# Patient Record
Sex: Female | Born: 1970 | Race: White | Hispanic: No | Marital: Married | Smoking: Never smoker
Health system: Southern US, Community
[De-identification: ages and names within clinical notes are randomized; demographics above are authoritative.]

## PROBLEM LIST (undated history)

## (undated) DIAGNOSIS — R921 Mammographic calcification found on diagnostic imaging of breast: Secondary | ICD-10-CM

## (undated) DIAGNOSIS — R51 Headache: Secondary | ICD-10-CM

## (undated) DIAGNOSIS — Z98811 Dental restoration status: Secondary | ICD-10-CM

## (undated) HISTORY — PX: WISDOM TOOTH EXTRACTION: SHX21

---

## 1998-01-26 ENCOUNTER — Other Ambulatory Visit: Admission: RE | Admit: 1998-01-26 | Discharge: 1998-01-26 | Payer: Self-pay | Admitting: Obstetrics and Gynecology

## 1998-02-17 ENCOUNTER — Other Ambulatory Visit: Admission: RE | Admit: 1998-02-17 | Discharge: 1998-02-17 | Payer: Self-pay | Admitting: Obstetrics and Gynecology

## 1998-06-18 ENCOUNTER — Other Ambulatory Visit: Admission: RE | Admit: 1998-06-18 | Discharge: 1998-06-18 | Payer: Self-pay | Admitting: Obstetrics and Gynecology

## 1999-05-24 ENCOUNTER — Other Ambulatory Visit: Admission: RE | Admit: 1999-05-24 | Discharge: 1999-05-24 | Payer: Self-pay | Admitting: Obstetrics and Gynecology

## 1999-05-24 ENCOUNTER — Encounter (INDEPENDENT_AMBULATORY_CARE_PROVIDER_SITE_OTHER): Payer: Self-pay | Admitting: Specialist

## 2000-01-11 ENCOUNTER — Other Ambulatory Visit: Admission: RE | Admit: 2000-01-11 | Discharge: 2000-01-11 | Payer: Self-pay | Admitting: Obstetrics and Gynecology

## 2000-06-01 ENCOUNTER — Other Ambulatory Visit: Admission: RE | Admit: 2000-06-01 | Discharge: 2000-06-01 | Payer: Self-pay | Admitting: Obstetrics and Gynecology

## 2000-11-16 ENCOUNTER — Other Ambulatory Visit: Admission: RE | Admit: 2000-11-16 | Discharge: 2000-11-16 | Payer: Self-pay | Admitting: Obstetrics and Gynecology

## 2001-07-04 ENCOUNTER — Other Ambulatory Visit: Admission: RE | Admit: 2001-07-04 | Discharge: 2001-07-04 | Payer: Self-pay | Admitting: Obstetrics and Gynecology

## 2002-01-22 ENCOUNTER — Other Ambulatory Visit: Admission: RE | Admit: 2002-01-22 | Discharge: 2002-01-22 | Payer: Self-pay | Admitting: Obstetrics and Gynecology

## 2002-07-02 ENCOUNTER — Other Ambulatory Visit: Admission: RE | Admit: 2002-07-02 | Discharge: 2002-07-02 | Payer: Self-pay | Admitting: Obstetrics and Gynecology

## 2003-07-07 ENCOUNTER — Other Ambulatory Visit: Admission: RE | Admit: 2003-07-07 | Discharge: 2003-07-07 | Payer: Self-pay | Admitting: Obstetrics and Gynecology

## 2004-09-07 ENCOUNTER — Other Ambulatory Visit: Admission: RE | Admit: 2004-09-07 | Discharge: 2004-09-07 | Payer: Self-pay | Admitting: Obstetrics and Gynecology

## 2005-11-03 ENCOUNTER — Other Ambulatory Visit: Admission: RE | Admit: 2005-11-03 | Discharge: 2005-11-03 | Payer: Self-pay | Admitting: Obstetrics & Gynecology

## 2005-11-15 ENCOUNTER — Ambulatory Visit (HOSPITAL_COMMUNITY): Admission: RE | Admit: 2005-11-15 | Discharge: 2005-11-15 | Payer: Self-pay | Admitting: Obstetrics & Gynecology

## 2005-11-17 ENCOUNTER — Encounter: Admission: RE | Admit: 2005-11-17 | Discharge: 2005-11-17 | Payer: Self-pay | Admitting: Obstetrics & Gynecology

## 2006-10-04 ENCOUNTER — Inpatient Hospital Stay (HOSPITAL_COMMUNITY): Admission: AD | Admit: 2006-10-04 | Discharge: 2006-10-06 | Payer: Self-pay | Admitting: Obstetrics and Gynecology

## 2006-10-04 ENCOUNTER — Inpatient Hospital Stay (HOSPITAL_COMMUNITY): Admission: AD | Admit: 2006-10-04 | Discharge: 2006-10-04 | Payer: Self-pay | Admitting: *Deleted

## 2008-09-13 ENCOUNTER — Inpatient Hospital Stay (HOSPITAL_COMMUNITY): Admission: AD | Admit: 2008-09-13 | Discharge: 2008-09-16 | Payer: Self-pay | Admitting: Obstetrics and Gynecology

## 2010-03-07 ENCOUNTER — Encounter: Payer: Self-pay | Admitting: Obstetrics and Gynecology

## 2010-05-22 LAB — CBC
HCT: 32.4 % — ABNORMAL LOW (ref 36.0–46.0)
Hemoglobin: 11.3 g/dL — ABNORMAL LOW (ref 12.0–15.0)
MCHC: 34.8 g/dL (ref 30.0–36.0)
MCV: 97.9 fL (ref 78.0–100.0)
RBC: 3.31 MIL/uL — ABNORMAL LOW (ref 3.87–5.11)
WBC: 9.7 10*3/uL (ref 4.0–10.5)

## 2010-05-22 LAB — RH IMMUNE GLOB WKUP(>/=20WKS)(NOT WOMEN'S HOSP)

## 2010-05-22 LAB — CCBB MATERNAL DONOR DRAW

## 2010-05-23 LAB — RPR: RPR Ser Ql: NONREACTIVE

## 2010-05-23 LAB — CBC
MCV: 97.2 fL (ref 78.0–100.0)
Platelets: 193 10*3/uL (ref 150–400)
WBC: 9.8 10*3/uL (ref 4.0–10.5)

## 2010-06-29 NOTE — Consult Note (Signed)
Alexis Bell, Alexis Bell             ACCOUNT NO.:  [id number]   MEDICAL RECORD NO.:  [id number]          PATIENT TYPE:  MAT   LOCATION:  [facility]                          FACILITY:  [facility]   PHYSICIAN:  Lenoard Aden, M.D.DATE OF BIRTH:  1971/02/04   DATE OF CONSULTATION:  DATE OF DISCHARGE:  10/04/2006                                 CONSULTATION   CHIEF COMPLAINT:  Contractions.   She is a 41 year old white female GI, P0 at 38-1/2 weeks of gestation  who presents with increased frequency of contractions this evening.  She  denies leakage of fluid.  She reports good fetal movement.  She denies  bleeding.   ALLERGIES:  She has no known drug allergies.   MEDICATIONS:  Prenatal vitamins.   FAMILY HISTORY:  Diabetes, hypothyroidism, breast cancer, schizophrenia.   SOCIAL HISTORY:  She is a nonsmoker, nondrinker.  She denies domestic or  physical violence.   PAST SURGICAL HISTORY:  She has a surgical history remarkable for a LEEP  in 2000 and also has a personal history of condyloma.   PREGNANCY HISTORY:  Noncontributory.   OBSTETRIC CARE:  To date has been uncomplicated.  She has uncomplicated  __________with a normal 20-week ultrasound and normal ultrasound  revealing an estimated fetal weight in the 27th percentile with slightly  increased amniotic fluid on September 21, 2006.  GBS is negative.   PHYSICAL EXAMINATION:  GENERAL:  She is a well-developed, well-nourished  slightly uncomfortable female in no acute distress.  HEENT:  Normal.  LUNGS:  Clear.  HEART:  Regular rhythm.  ABDOMEN:  Soft, gravid, nontender.  Estimated fetal weight 67 pounds.  Cervix is 1 cm, 90% effaced, vertex in 0 to +1 station.  EXTREMITIES:  There are no cords.  NEUROLOGIC:  Nonfocal.  SKIN:  Intact.   NST is reactive.  Contractions are regular.  The patient is ambulated  for three hours and rechecked with no cervical change noted.   IMPRESSION:  A 38-week __________ prodromal labor pattern with  reassuring fetal heart rates.   PLAN:  Discharge home.  Ambien given.  Follow up in the office as  scheduled.  Strict labor warnings given.      Lenoard Aden, M.D.  Electronically Signed     RJT/MEDQ  D:  10/04/2006  T:  10/04/2006  Job:  [id number]

## 2010-11-26 LAB — CBC
HCT: 33.2 — ABNORMAL LOW
Hemoglobin: 11.6 — ABNORMAL LOW
Hemoglobin: 13.3
MCHC: 34.8
Platelets: 181
RDW: 12.4
RDW: 12.5
WBC: 19 — ABNORMAL HIGH

## 2010-11-26 LAB — RH IMMUNE GLOB WKUP(>/=20WKS)(NOT WOMEN'S HOSP): Fetal Screen: NEGATIVE

## 2011-09-16 ENCOUNTER — Other Ambulatory Visit: Payer: Self-pay | Admitting: Obstetrics and Gynecology

## 2011-09-16 DIAGNOSIS — Z1231 Encounter for screening mammogram for malignant neoplasm of breast: Secondary | ICD-10-CM

## 2011-09-30 ENCOUNTER — Ambulatory Visit
Admission: RE | Admit: 2011-09-30 | Discharge: 2011-09-30 | Disposition: A | Discharge status: Home / Self Care | Payer: 59 | Source: Ambulatory Visit | Attending: Obstetrics and Gynecology | Admitting: Obstetrics and Gynecology

## 2011-09-30 DIAGNOSIS — Z1231 Encounter for screening mammogram for malignant neoplasm of breast: Secondary | ICD-10-CM

## 2011-10-05 ENCOUNTER — Other Ambulatory Visit: Payer: Self-pay | Admitting: Obstetrics and Gynecology

## 2011-10-05 DIAGNOSIS — R928 Other abnormal and inconclusive findings on diagnostic imaging of breast: Secondary | ICD-10-CM

## 2011-10-12 ENCOUNTER — Other Ambulatory Visit: Payer: Self-pay | Admitting: Obstetrics and Gynecology

## 2011-10-12 ENCOUNTER — Ambulatory Visit
Admission: RE | Admit: 2011-10-12 | Discharge: 2011-10-12 | Disposition: A | Discharge status: Home / Self Care | Payer: 59 | Source: Ambulatory Visit | Attending: Obstetrics and Gynecology | Admitting: Obstetrics and Gynecology

## 2011-10-12 DIAGNOSIS — R928 Other abnormal and inconclusive findings on diagnostic imaging of breast: Secondary | ICD-10-CM

## 2011-10-12 DIAGNOSIS — R921 Mammographic calcification found on diagnostic imaging of breast: Secondary | ICD-10-CM

## 2011-10-20 ENCOUNTER — Ambulatory Visit
Admission: RE | Admit: 2011-10-20 | Discharge: 2011-10-20 | Disposition: A | Discharge status: Home / Self Care | Payer: 59 | Source: Ambulatory Visit | Attending: Obstetrics and Gynecology | Admitting: Obstetrics and Gynecology

## 2011-10-20 ENCOUNTER — Other Ambulatory Visit: Payer: Self-pay | Admitting: Obstetrics and Gynecology

## 2011-10-20 DIAGNOSIS — R921 Mammographic calcification found on diagnostic imaging of breast: Secondary | ICD-10-CM

## 2011-11-07 ENCOUNTER — Ambulatory Visit (INDEPENDENT_AMBULATORY_CARE_PROVIDER_SITE_OTHER): Payer: 59 | Admitting: Surgery

## 2011-11-15 DIAGNOSIS — R921 Mammographic calcification found on diagnostic imaging of breast: Secondary | ICD-10-CM

## 2011-11-15 HISTORY — DX: Mammographic calcification found on diagnostic imaging of breast: R92.1

## 2011-11-17 ENCOUNTER — Ambulatory Visit (INDEPENDENT_AMBULATORY_CARE_PROVIDER_SITE_OTHER): Payer: 59 | Admitting: Surgery

## 2011-11-17 ENCOUNTER — Encounter (INDEPENDENT_AMBULATORY_CARE_PROVIDER_SITE_OTHER): Payer: Self-pay | Admitting: Surgery

## 2011-11-17 VITALS — BP 94/58 | HR 68 | Temp 99.2°F | Resp 16 | Ht 68.0 in | Wt 143.6 lb

## 2011-11-17 DIAGNOSIS — R921 Mammographic calcification found on diagnostic imaging of breast: Secondary | ICD-10-CM

## 2011-11-17 DIAGNOSIS — R928 Other abnormal and inconclusive findings on diagnostic imaging of breast: Secondary | ICD-10-CM

## 2011-11-17 NOTE — Patient Instructions (Signed)
We will schedule outpatient surgery to remove the small calcifications in your left breast

## 2011-11-17 NOTE — Progress Notes (Signed)
Patient ID: Alexis Bell, female   DOB: [birth year], 41 y.o.   MRN: [id number]  Chief Complaint  Patient presents with  . Breast Problem    eval Lt br calcifications    HPI Alexis Bell is a 41 y.o. female.  She was recently found to have a small focus of linear calcifications in the upper left breast. She is not a candidate for a needle core biopsies of surgical excision was recommended. She had her original films done at the breast center as a second opinion from the other radiologists in town. She is no other family history of significance and no symptoms in her breast. HPI  History reviewed. No pertinent past medical history.  History reviewed. No pertinent past surgical history.  Family History  Problem Relation Age of Onset  . Cancer Maternal Grandmother     breast    Social History History  Substance Use Topics  . Smoking status: Never Smoker   . Smokeless tobacco: Never Used  . Alcohol Use: No    No Known Allergies  No current outpatient prescriptions on file.    Review of Systems Review of Systems  Constitutional: Negative for fever, chills and unexpected weight change.  HENT: Negative for hearing loss, congestion, sore throat, trouble swallowing and voice change.   Eyes: Negative for visual disturbance.  Respiratory: Negative for cough and wheezing.   Cardiovascular: Negative for chest pain, palpitations and leg swelling.  Gastrointestinal: Negative for nausea, vomiting, abdominal pain, diarrhea, constipation, blood in stool, abdominal distention and anal bleeding.  Genitourinary: Negative for hematuria, vaginal bleeding and difficulty urinating.  Musculoskeletal: Negative for arthralgias.  Skin: Negative for rash and wound.  Neurological: Negative for seizures, syncope and headaches.  Hematological: Negative for adenopathy. Does not bruise/bleed easily.  Psychiatric/Behavioral: Negative for confusion.    Blood pressure 94/58, pulse 68, temperature  99.2 F (37.3 C), temperature source Temporal, resp. rate 16, height 5\' 8"  (1.727 m), weight 143 lb 9.6 oz (65.137 kg), last menstrual period 11/03/2011.  Physical Exam Physical Exam  Vitals reviewed. Constitutional: She is oriented to person, place, and time. She appears well-developed and well-nourished. No distress.  HENT:  Head: Normocephalic and atraumatic.  Mouth/Throat: Oropharynx is clear and moist.  Eyes: Conjunctivae normal and EOM are normal. Pupils are equal, round, and reactive to light. No scleral icterus.  Neck: Normal range of motion. Neck supple. No tracheal deviation present. No thyromegaly present.  Cardiovascular: Normal rate, regular rhythm, normal heart sounds and intact distal pulses.  Exam reveals no gallop and no friction rub.   No murmur heard. Pulmonary/Chest: Effort normal and breath sounds normal. No respiratory distress. She has no wheezes. She has no rales.       Breasts are bilaterally symmetric without mass. They are not tender.  Abdominal: Soft. Bowel sounds are normal. She exhibits no distension and no mass. There is no tenderness. There is no rebound and no guarding.  Musculoskeletal: Normal range of motion. She exhibits no edema and no tenderness.  Lymphadenopathy:    She has no cervical adenopathy.    She has axillary adenopathy.       Right: No supraclavicular adenopathy present.  Neurological: She is alert and oriented to person, place, and time.  Skin: Skin is warm and dry. No rash noted. She is not diaphoretic. No erythema.  Psychiatric: She has a normal mood and affect. Her behavior is normal. Judgment and thought content normal.    Data Reviewed I reviewed the mammogram  reports.  Assessment    Somewhat suspicious calcifications left breast    Plan    I think she needs to have a wire localized excision. She would like to schedule this. I have discussed the indications for the lumpectomy and described the procedure. She understand that  the chance of removal of the abnormal area is very good, but that occasionally we are unable to locate it and may have to do a second procedure. We also discussed the possibility of a second procedure to get additional tissue. Risks of surgery such as bleeding and infection have also been explained, as well as the implications of not doing the surgery. She understands and wishes to proceed.        Aysen Shieh J 11/17/2011, 4:45 PM

## 2011-12-06 ENCOUNTER — Encounter (HOSPITAL_BASED_OUTPATIENT_CLINIC_OR_DEPARTMENT_OTHER): Payer: Self-pay | Admitting: *Deleted

## 2011-12-12 ENCOUNTER — Ambulatory Visit (HOSPITAL_BASED_OUTPATIENT_CLINIC_OR_DEPARTMENT_OTHER): Payer: 59 | Admitting: Certified Registered Nurse Anesthetist

## 2011-12-12 ENCOUNTER — Encounter (HOSPITAL_BASED_OUTPATIENT_CLINIC_OR_DEPARTMENT_OTHER): Payer: Self-pay | Admitting: Certified Registered Nurse Anesthetist

## 2011-12-12 ENCOUNTER — Ambulatory Visit (HOSPITAL_BASED_OUTPATIENT_CLINIC_OR_DEPARTMENT_OTHER)
Admission: RE | Admit: 2011-12-12 | Discharge: 2011-12-12 | Disposition: A | Discharge status: Home / Self Care | Payer: 59 | Source: Ambulatory Visit | Attending: Surgery | Admitting: Surgery

## 2011-12-12 ENCOUNTER — Encounter (HOSPITAL_BASED_OUTPATIENT_CLINIC_OR_DEPARTMENT_OTHER): Payer: Self-pay | Admitting: *Deleted

## 2011-12-12 ENCOUNTER — Encounter (HOSPITAL_BASED_OUTPATIENT_CLINIC_OR_DEPARTMENT_OTHER)
Admission: RE | Disposition: A | Discharge status: Home / Self Care | Payer: Self-pay | Source: Ambulatory Visit | Attending: Surgery

## 2011-12-12 DIAGNOSIS — Z803 Family history of malignant neoplasm of breast: Secondary | ICD-10-CM | POA: Insufficient documentation

## 2011-12-12 DIAGNOSIS — R921 Mammographic calcification found on diagnostic imaging of breast: Secondary | ICD-10-CM

## 2011-12-12 DIAGNOSIS — D249 Benign neoplasm of unspecified breast: Secondary | ICD-10-CM | POA: Insufficient documentation

## 2011-12-12 DIAGNOSIS — N6019 Diffuse cystic mastopathy of unspecified breast: Secondary | ICD-10-CM

## 2011-12-12 DIAGNOSIS — R920 Mammographic microcalcification found on diagnostic imaging of breast: Secondary | ICD-10-CM | POA: Insufficient documentation

## 2011-12-12 HISTORY — DX: Headache: R51

## 2011-12-12 HISTORY — PX: BREAST BIOPSY: SHX20

## 2011-12-12 HISTORY — DX: Mammographic calcification found on diagnostic imaging of breast: R92.1

## 2011-12-12 HISTORY — DX: Dental restoration status: Z98.811

## 2011-12-12 SURGERY — BREAST BIOPSY WITH NEEDLE LOCALIZATION
Anesthesia: General | Site: Breast | Laterality: Left | Wound class: Clean

## 2011-12-12 MED ORDER — ACETAMINOPHEN 10 MG/ML IV SOLN
INTRAVENOUS | Status: DC | PRN
  Administered 2011-12-12: 1000 mg via INTRAVENOUS

## 2011-12-12 MED ORDER — DEXAMETHASONE SODIUM PHOSPHATE 4 MG/ML IJ SOLN
INTRAMUSCULAR | Status: DC | PRN
  Administered 2011-12-12: 10 mg via INTRAVENOUS

## 2011-12-12 MED ORDER — BUPIVACAINE HCL (PF) 0.25 % IJ SOLN
INTRAMUSCULAR | Status: DC | PRN
  Administered 2011-12-12: 20 mL

## 2011-12-12 MED ORDER — PROPOFOL 10 MG/ML IV BOLUS
INTRAVENOUS | Status: DC | PRN
  Administered 2011-12-12: 200 mg via INTRAVENOUS

## 2011-12-12 MED ORDER — CHLORHEXIDINE GLUCONATE 4 % EX LIQD
1.0000 | Freq: Once | CUTANEOUS | Status: DC
Start: 2011-12-12 — End: 2011-12-12

## 2011-12-12 MED ORDER — FENTANYL CITRATE 0.05 MG/ML IJ SOLN
INTRAMUSCULAR | Status: DC | PRN
  Administered 2011-12-12: 25 ug via INTRAVENOUS
  Administered 2011-12-12: 50 ug via INTRAVENOUS
  Administered 2011-12-12: 25 ug via INTRAVENOUS

## 2011-12-12 MED ORDER — LIDOCAINE HCL (CARDIAC) 20 MG/ML IV SOLN
INTRAVENOUS | Status: DC | PRN
  Administered 2011-12-12: 60 mg via INTRAVENOUS

## 2011-12-12 MED ORDER — LACTATED RINGERS IV SOLN
INTRAVENOUS | Status: DC
  Administered 2011-12-12 (×2): via INTRAVENOUS

## 2011-12-12 MED ORDER — OXYCODONE HCL 5 MG PO TABS: 5.0000 mg | ORAL_TABLET | ORAL | Status: DC | PRN

## 2011-12-12 MED ORDER — ONDANSETRON HCL 4 MG/2ML IJ SOLN
INTRAMUSCULAR | Status: DC | PRN
  Administered 2011-12-12: 4 mg via INTRAVENOUS

## 2011-12-12 MED ORDER — MIDAZOLAM HCL 5 MG/5ML IJ SOLN
INTRAMUSCULAR | Status: DC | PRN
  Administered 2011-12-12: 2 mg via INTRAVENOUS

## 2011-12-12 MED ORDER — CEFAZOLIN SODIUM-DEXTROSE 2-3 GM-% IV SOLR
2.0000 g | INTRAVENOUS | Status: AC
  Administered 2011-12-12: 2 g via INTRAVENOUS

## 2011-12-12 MED ORDER — CHLORHEXIDINE GLUCONATE 4 % EX LIQD: 1.0000 "application " | Freq: Once | CUTANEOUS | Status: DC

## 2011-12-12 SURGICAL SUPPLY — 50 items
ADH SKN CLS APL DERMABOND .7 (GAUZE/BANDAGES/DRESSINGS) ×1
APPLICATOR COTTON TIP 6IN STRL (MISCELLANEOUS) IMPLANT
BINDER BREAST LRG (GAUZE/BANDAGES/DRESSINGS) IMPLANT
BINDER BREAST MEDIUM (GAUZE/BANDAGES/DRESSINGS) IMPLANT
BINDER BREAST XLRG (GAUZE/BANDAGES/DRESSINGS) IMPLANT
BINDER BREAST XXLRG (GAUZE/BANDAGES/DRESSINGS) IMPLANT
BLADE HEX COATED 2.75 (ELECTRODE) ×2 IMPLANT
BLADE SURG 15 STRL LF DISP TIS (BLADE) ×1 IMPLANT
BLADE SURG 15 STRL SS (BLADE) ×2
CANISTER SUCTION 1200CC (MISCELLANEOUS) ×2 IMPLANT
CHLORAPREP W/TINT 26ML (MISCELLANEOUS) ×2 IMPLANT
CLIP TI MEDIUM 6 (CLIP) IMPLANT
CLIP TI WIDE RED SMALL 6 (CLIP) IMPLANT
CLOTH BEACON ORANGE TIMEOUT ST (SAFETY) ×2 IMPLANT
COVER MAYO STAND STRL (DRAPES) ×2 IMPLANT
COVER TABLE BACK 60X90 (DRAPES) ×2 IMPLANT
DECANTER SPIKE VIAL GLASS SM (MISCELLANEOUS) IMPLANT
DERMABOND ADVANCED (GAUZE/BANDAGES/DRESSINGS) ×1
DERMABOND ADVANCED .7 DNX12 (GAUZE/BANDAGES/DRESSINGS) ×1 IMPLANT
DEVICE DUBIN W/COMP PLATE 8390 (MISCELLANEOUS) IMPLANT
DRAPE LAPAROTOMY TRNSV 102X78 (DRAPE) ×2 IMPLANT
DRAPE SURG 17X23 STRL (DRAPES) ×1 IMPLANT
DRAPE UTILITY XL STRL (DRAPES) ×2 IMPLANT
ELECT REM PT RETURN 9FT ADLT (ELECTROSURGICAL) ×2
ELECTRODE REM PT RTRN 9FT ADLT (ELECTROSURGICAL) ×1 IMPLANT
GLOVE BIO SURGEON STRL SZ 6.5 (GLOVE) ×1 IMPLANT
GLOVE BIOGEL PI IND STRL 7.0 (GLOVE) IMPLANT
GLOVE BIOGEL PI INDICATOR 7.0 (GLOVE) ×1
GLOVE EUDERMIC 7 POWDERFREE (GLOVE) ×2 IMPLANT
GOWN PREVENTION PLUS XLARGE (GOWN DISPOSABLE) ×4 IMPLANT
KIT MARKER MARGIN INK (KITS) IMPLANT
NDL HYPO 25X1 1.5 SAFETY (NEEDLE) ×1 IMPLANT
NEEDLE HYPO 25X1 1.5 SAFETY (NEEDLE) ×2 IMPLANT
NS IRRIG 1000ML POUR BTL (IV SOLUTION) IMPLANT
PACK BASIN DAY SURGERY FS (CUSTOM PROCEDURE TRAY) ×2 IMPLANT
PENCIL BUTTON HOLSTER BLD 10FT (ELECTRODE) ×2 IMPLANT
SHEET MEDIUM DRAPE 40X70 STRL (DRAPES) ×1 IMPLANT
SLEEVE SCD COMPRESS KNEE MED (MISCELLANEOUS) ×2 IMPLANT
SPONGE GAUZE 4X4 12PLY (GAUZE/BANDAGES/DRESSINGS) IMPLANT
SPONGE INTESTINAL PEANUT (DISPOSABLE) IMPLANT
SPONGE LAP 4X18 X RAY DECT (DISPOSABLE) ×2 IMPLANT
STAPLER VISISTAT 35W (STAPLE) IMPLANT
SUT MNCRL AB 4-0 PS2 18 (SUTURE) ×2 IMPLANT
SUT SILK 0 TIES 10X30 (SUTURE) IMPLANT
SUT VICRYL 3-0 CR8 SH (SUTURE) ×2 IMPLANT
SYR CONTROL 10ML LL (SYRINGE) ×2 IMPLANT
TOWEL OR NON WOVEN STRL DISP B (DISPOSABLE) ×2 IMPLANT
TUBE CONNECTING 20X1/4 (TUBING) ×2 IMPLANT
WATER STERILE IRR 1000ML POUR (IV SOLUTION) ×1 IMPLANT
YANKAUER SUCT BULB TIP NO VENT (SUCTIONS) ×2 IMPLANT

## 2011-12-12 NOTE — Anesthesia Postprocedure Evaluation (Signed)
  Anesthesia Post-op Note  Patient: Alexis Bell  Procedure(s) Performed: Procedure(s) (LRB) with comments: BREAST BIOPSY WITH NEEDLE LOCALIZATION (Left) - Wire localization excision Left breast calcifications  Patient Location: PACU  Anesthesia Type: @ANTYPEFINAL @   Level of Consciousness: awake, alert  and oriented  Airway and Oxygen Therapy: Patient Spontanous Breathing  Post-op Pain: none  Post-op Assessment: Post-op Vital signs reviewed  Post-op Vital Signs: Reviewed  Complications: No apparent anesthesia complications

## 2011-12-12 NOTE — Anesthesia Procedure Notes (Signed)
Procedure Name: LMA Insertion Date/Time: 12/12/2011 1:11 PM Performed by: Dimitrious Micciche D Pre-anesthesia Checklist: Patient identified, Emergency Drugs available, Suction available and Patient being monitored Patient Re-evaluated:Patient Re-evaluated prior to inductionOxygen Delivery Method: Circle System Utilized Preoxygenation: Pre-oxygenation with 100% oxygen Intubation Type: IV induction Ventilation: Mask ventilation without difficulty LMA: LMA inserted LMA Size: 4.0 Number of attempts: 1 Airway Equipment and Method: bite block Placement Confirmation: positive ETCO2 Tube secured with: Tape Dental Injury: Teeth and Oropharynx as per pre-operative assessment

## 2011-12-12 NOTE — Anesthesia Preprocedure Evaluation (Signed)

## 2011-12-12 NOTE — Anesthesia Postprocedure Evaluation (Signed)
  Anesthesia Post-op Note  Patient: Alexis Bell  Procedure(s) Performed: Procedure(s) (LRB) with comments: BREAST BIOPSY WITH NEEDLE LOCALIZATION (Left) - Wire localization excision Left breast calcifications  Patient Location: PACU  Anesthesia Type: General  Level of Consciousness: awake, alert  and oriented  Airway and Oxygen Therapy: Patient Spontanous Breathing  Post-op Pain: none  Post-op Assessment: Post-op Vital signs reviewed  Post-op Vital Signs: Reviewed  Complications: No apparent anesthesia complications

## 2011-12-12 NOTE — H&P (View-Only) (Signed)
Patient ID: Alexis Bell, female   DOB: 01/11/1971, 41 y.o.   MRN: [id number]  Chief Complaint  Patient presents with  . Breast Problem    eval Lt br calcifications    HPI Alexis Bell is a 41 y.o. female.  She was recently found to have a small focus of linear calcifications in the upper left breast. She is not a candidate for a needle core biopsies of surgical excision was recommended. She had her original films done at the breast center as a second opinion from the other radiologists in town. She is no other family history of significance and no symptoms in her breast. HPI  History reviewed. No pertinent past medical history.  History reviewed. No pertinent past surgical history.  Family History  Problem Relation Age of Onset  . Cancer Maternal Grandmother     breast    Social History History  Substance Use Topics  . Smoking status: Never Smoker   . Smokeless tobacco: Never Used  . Alcohol Use: No    No Known Allergies  No current outpatient prescriptions on file.    Review of Systems Review of Systems  Constitutional: Negative for fever, chills and unexpected weight change.  HENT: Negative for hearing loss, congestion, sore throat, trouble swallowing and voice change.   Eyes: Negative for visual disturbance.  Respiratory: Negative for cough and wheezing.   Cardiovascular: Negative for chest pain, palpitations and leg swelling.  Gastrointestinal: Negative for nausea, vomiting, abdominal pain, diarrhea, constipation, blood in stool, abdominal distention and anal bleeding.  Genitourinary: Negative for hematuria, vaginal bleeding and difficulty urinating.  Musculoskeletal: Negative for arthralgias.  Skin: Negative for rash and wound.  Neurological: Negative for seizures, syncope and headaches.  Hematological: Negative for adenopathy. Does not bruise/bleed easily.  Psychiatric/Behavioral: Negative for confusion.    Blood pressure 94/58, pulse 68, temperature  99.2 F (37.3 C), temperature source Temporal, resp. rate 16, height 5' 8" (1.727 m), weight 143 lb 9.6 oz (65.137 kg), last menstrual period 11/03/2011.  Physical Exam Physical Exam  Vitals reviewed. Constitutional: She is oriented to person, place, and time. She appears well-developed and well-nourished. No distress.  HENT:  Head: Normocephalic and atraumatic.  Mouth/Throat: Oropharynx is clear and moist.  Eyes: Conjunctivae normal and EOM are normal. Pupils are equal, round, and reactive to light. No scleral icterus.  Neck: Normal range of motion. Neck supple. No tracheal deviation present. No thyromegaly present.  Cardiovascular: Normal rate, regular rhythm, normal heart sounds and intact distal pulses.  Exam reveals no gallop and no friction rub.   No murmur heard. Pulmonary/Chest: Effort normal and breath sounds normal. No respiratory distress. She has no wheezes. She has no rales.       Breasts are bilaterally symmetric without mass. They are not tender.  Abdominal: Soft. Bowel sounds are normal. She exhibits no distension and no mass. There is no tenderness. There is no rebound and no guarding.  Musculoskeletal: Normal range of motion. She exhibits no edema and no tenderness.  Lymphadenopathy:    She has no cervical adenopathy.    She has axillary adenopathy.       Right: No supraclavicular adenopathy present.  Neurological: She is alert and oriented to person, place, and time.  Skin: Skin is warm and dry. No rash noted. She is not diaphoretic. No erythema.  Psychiatric: She has a normal mood and affect. Her behavior is normal. Judgment and thought content normal.    Data Reviewed I reviewed the mammogram   reports.  Assessment    Somewhat suspicious calcifications left breast    Plan    I think she needs to have a wire localized excision. She would like to schedule this. I have discussed the indications for the lumpectomy and described the procedure. She understand that  the chance of removal of the abnormal area is very good, but that occasionally we are unable to locate it and may have to do a second procedure. We also discussed the possibility of a second procedure to get additional tissue. Risks of surgery such as bleeding and infection have also been explained, as well as the implications of not doing the surgery. She understands and wishes to proceed.        Song Garris J 11/17/2011, 4:45 PM    

## 2011-12-12 NOTE — Interval H&P Note (Signed)
History and Physical Interval Note:  12/12/2011 12:49 PM  Alexis Bell  has presented today for surgery, with the diagnosis of Left breast calcifications  The various methods of treatment have been discussed with the patient and family. After consideration of risks, benefits and other options for treatment, the patient has consented to  Procedure(s) (LRB) with comments: BREAST BIOPSY WITH NEEDLE LOCALIZATION (Left) - Wire localization excision Left breast calcifications as a surgical intervention .  The patient's history has been reviewed, patient examined, no change in status, stable for surgery.  I have reviewed the patient's chart and labs.  Questions were answered to the patient's satisfaction.   The left breast is marked at the operative side and the wire loc films reviewed  [name]

## 2011-12-12 NOTE — Op Note (Signed)
Hermina Baumgardner Mumford  10/02/1970  [id number]  12/12/2011   Preoperative diagnosis: Left breast calcifications  Postoperative diagnosis: sAME  Procedure: Nl Excisin of left berast calcifications  Surgeon: Currie Paris, MD, FACS  Anesthesia: General  Clinical History and Indications: this patient presents for a guidewire localized excision of a left breast area of Ca++.  Description of procedure: The patient was seen in the holding area and the plans for the procedure reviewed. The left breast was marked as the operative side. The wire localizing films were reviewed.  The patient was taken to the operating room and after satisfactory general anesthesia had been obtained the left breast was prepped and draped and the timeout was performed.  The incision was made over the presumed area of the mass. The guidewire entered in the UOQ and tracked inferior and lateral. Skin flaps were raised and using cautery the area was completely excised. Bleeders were controlled with either cautery or sutures as needed.  After achieving hemostasis, the incision was closed with 3-0 Vicryl, 4-0 Monocryl subcuticular, and Dermabond.  The patient tolerated the procedure well. There were no operative complications. All counts were correct.   EBL: Minimal  Currie Paris, MD, FACS 12/12/2011 1:50 PM

## 2011-12-13 ENCOUNTER — Encounter (HOSPITAL_BASED_OUTPATIENT_CLINIC_OR_DEPARTMENT_OTHER): Payer: Self-pay | Admitting: Certified Registered Nurse Anesthetist

## 2011-12-13 NOTE — Transfer of Care (Signed)
Immediate Anesthesia Transfer of Care Note  Patient: Alexis Bell  Procedure(s) Performed: Procedure(s) (LRB) with comments: BREAST BIOPSY WITH NEEDLE LOCALIZATION (Left) - Wire localization excision Left breast calcifications  Patient Location: PACU  Anesthesia Type:General  Level of Consciousness: awake, alert , oriented and patient cooperative  Airway & Oxygen Therapy: Patient Spontanous Breathing and Patient connected to face mask oxygen  Post-op Assessment: Report given to PACU RN and Post -op Vital signs reviewed and stable  Post vital signs: Reviewed and stable  Complications: No apparent anesthesia complications

## 2011-12-15 ENCOUNTER — Telehealth (INDEPENDENT_AMBULATORY_CARE_PROVIDER_SITE_OTHER): Payer: Self-pay | Admitting: General Surgery

## 2011-12-15 NOTE — Telephone Encounter (Signed)
Patient made aware of pathology results. Benign fibroadenoma.

## 2011-12-27 ENCOUNTER — Encounter (INDEPENDENT_AMBULATORY_CARE_PROVIDER_SITE_OTHER): Payer: Self-pay | Admitting: Surgery

## 2011-12-27 ENCOUNTER — Ambulatory Visit (INDEPENDENT_AMBULATORY_CARE_PROVIDER_SITE_OTHER): Payer: 59 | Admitting: Surgery

## 2011-12-27 VITALS — BP 118/82 | HR 68 | Temp 99.2°F | Resp 14 | Ht 68.0 in | Wt 144.0 lb

## 2011-12-27 DIAGNOSIS — R921 Mammographic calcification found on diagnostic imaging of breast: Secondary | ICD-10-CM

## 2011-12-27 DIAGNOSIS — R928 Other abnormal and inconclusive findings on diagnostic imaging of breast: Secondary | ICD-10-CM

## 2011-12-27 NOTE — Progress Notes (Signed)
NAME: Alexis Bell                                            DOB: 04-22-1970 DATE: 12/27/2011                                                  MRN: [id number]  CC: Post op   HPI: This patient comes in for post op follow-up .Sheunderwent wire localized excision of some left breast calcifications on /28/2013. She feels that she is doing well.  PE:  VITAL SIGNS: BP 118/82  Pulse 68  Temp 99.2 F (37.3 C) (Temporal)  Resp 14  Ht 5\' 8"  (1.727 m)  Wt 144 lb (65.318 kg)  BMI 21.90 kg/m2  LMP 12/02/2011  General: The patient appears to be healthy, NAD No particular problems noted. Breast incision is healing nicely. No hematoma.  DATA REVIEWED:  is noted and shows a fibroadenoma with calcifications as well as some fibrocystic changes with calcifications.  IMPRESSION: The patient is doing well S/P wire localized left breast biopsy.    PLAN: Gave her a copy of the pathology report and discuss it with her. I will see her back here when necessary. Encouraged her to continue annual mammograms and routine follow up with her primary physicians.

## 2011-12-27 NOTE — Patient Instructions (Signed)
We will see you again on an as needed basis. Please call the office at [phone] if you have any questions or concerns. Thank you for allowing us to take care of you.  

## 2012-03-05 ENCOUNTER — Encounter (INDEPENDENT_AMBULATORY_CARE_PROVIDER_SITE_OTHER): Payer: Self-pay

## 2012-12-11 ENCOUNTER — Encounter (INDEPENDENT_AMBULATORY_CARE_PROVIDER_SITE_OTHER): Payer: Self-pay

## 2012-12-12 ENCOUNTER — Encounter (INDEPENDENT_AMBULATORY_CARE_PROVIDER_SITE_OTHER): Payer: Self-pay

## 2016-02-01 ENCOUNTER — Other Ambulatory Visit: Payer: Self-pay | Admitting: Internal Medicine

## 2016-02-01 DIAGNOSIS — M545 Low back pain: Secondary | ICD-10-CM

## 2016-02-10 ENCOUNTER — Ambulatory Visit
Admission: RE | Admit: 2016-02-10 | Discharge: 2016-02-10 | Disposition: A | Discharge status: Home / Self Care | Payer: Managed Care, Other (non HMO) | Source: Ambulatory Visit | Attending: Internal Medicine | Admitting: Internal Medicine

## 2016-02-10 DIAGNOSIS — M545 Low back pain: Secondary | ICD-10-CM

## 2016-02-10 MED ORDER — GADOBENATE DIMEGLUMINE 529 MG/ML IV SOLN
13.0000 mL | Freq: Once | INTRAVENOUS | Status: AC | PRN
  Administered 2016-02-10: 13 mL via INTRAVENOUS

## 2018-03-19 ENCOUNTER — Other Ambulatory Visit: Payer: Self-pay | Admitting: Internal Medicine

## 2018-03-19 DIAGNOSIS — M5416 Radiculopathy, lumbar region: Secondary | ICD-10-CM

## 2018-03-24 ENCOUNTER — Ambulatory Visit
Admission: RE | Admit: 2018-03-24 | Discharge: 2018-03-24 | Disposition: A | Discharge status: Home / Self Care | Payer: BC Managed Care – PPO | Source: Ambulatory Visit | Attending: Internal Medicine | Admitting: Internal Medicine

## 2018-03-24 DIAGNOSIS — M5416 Radiculopathy, lumbar region: Secondary | ICD-10-CM

## 2018-04-29 ENCOUNTER — Encounter (HOSPITAL_COMMUNITY): Payer: Self-pay | Admitting: Emergency Medicine

## 2018-04-29 ENCOUNTER — Other Ambulatory Visit: Payer: Self-pay

## 2018-04-29 ENCOUNTER — Emergency Department (HOSPITAL_COMMUNITY)
Admission: EM | Admit: 2018-04-29 | Discharge: 2018-04-29 | Disposition: A | Discharge status: Home / Self Care | Payer: BC Managed Care – PPO | Attending: Emergency Medicine | Admitting: Emergency Medicine

## 2018-04-29 DIAGNOSIS — R531 Weakness: Secondary | ICD-10-CM | POA: Diagnosis not present

## 2018-04-29 LAB — URINALYSIS, ROUTINE W REFLEX MICROSCOPIC
Bilirubin Urine: NEGATIVE
Glucose, UA: NEGATIVE mg/dL
Hgb urine dipstick: NEGATIVE
Ketones, ur: NEGATIVE mg/dL
Leukocytes,Ua: NEGATIVE
Nitrite: NEGATIVE
Protein, ur: NEGATIVE mg/dL
Specific Gravity, Urine: 1.01 (ref 1.005–1.030)
pH: 7 (ref 5.0–8.0)

## 2018-04-29 LAB — CBC
HCT: 36.4 % (ref 36.0–46.0)
Hemoglobin: 12 g/dL (ref 12.0–15.0)
MCH: 30.7 pg (ref 26.0–34.0)
MCHC: 33 g/dL (ref 30.0–36.0)
MCV: 93.1 fL (ref 80.0–100.0)
PLATELETS: 204 10*3/uL (ref 150–400)
RBC: 3.91 MIL/uL (ref 3.87–5.11)
RDW: 12.3 % (ref 11.5–15.5)
WBC: 6.4 10*3/uL (ref 4.0–10.5)
nRBC: 0 % (ref 0.0–0.2)

## 2018-04-29 LAB — INFLUENZA PANEL BY PCR (TYPE A & B)
Influenza A By PCR: NEGATIVE
Influenza B By PCR: NEGATIVE

## 2018-04-29 LAB — LACTIC ACID, PLASMA: Lactic Acid, Venous: 1.7 mmol/L (ref 0.5–1.9)

## 2018-04-29 LAB — BASIC METABOLIC PANEL
Anion gap: 8 (ref 5–15)
BUN: 12 mg/dL (ref 6–20)
CO2: 22 mmol/L (ref 22–32)
Calcium: 8 mg/dL — ABNORMAL LOW (ref 8.9–10.3)
Chloride: 106 mmol/L (ref 98–111)
Creatinine, Ser: 0.83 mg/dL (ref 0.44–1.00)
GFR calc Af Amer: >60 mL/min (ref >60)
GFR calc non Af Amer: >60 mL/min (ref >60)
Glucose, Bld: 116 mg/dL — ABNORMAL HIGH (ref 70–99)
Potassium: 3.5 mmol/L (ref 3.5–5.1)
SODIUM: 136 mmol/L (ref 135–145)

## 2018-04-29 LAB — TSH: TSH: 1.35 u[IU]/mL (ref 0.350–4.500)

## 2018-04-29 LAB — CBG MONITORING, ED: GLUCOSE-CAPILLARY: 103 mg/dL — AB (ref 70–99)

## 2018-04-29 LAB — I-STAT BETA HCG BLOOD, ED (MC, WL, AP ONLY): I-stat hCG, quantitative: <5 m[IU]/mL (ref <5)

## 2018-04-29 MED ORDER — ONDANSETRON HCL 4 MG/2ML IJ SOLN
4.0000 mg | Freq: Once | INTRAMUSCULAR | Status: AC
  Administered 2018-04-29: 4 mg via INTRAVENOUS
  Filled 2018-04-29: qty 2

## 2018-04-29 MED ORDER — ONDANSETRON HCL 4 MG PO TABS: 4.0000 mg | ORAL_TABLET | Freq: Three times a day (TID) | ORAL | 0 refills | Status: AC | PRN

## 2018-04-29 MED ORDER — LACTATED RINGERS IV BOLUS
1000.0000 mL | Freq: Once | INTRAVENOUS | Status: AC
  Administered 2018-04-29: 1000 mL via INTRAVENOUS

## 2018-04-29 NOTE — ED Notes (Signed)
ED Provider at bedside. 

## 2018-04-29 NOTE — ED Triage Notes (Signed)
Pt complaining of generalized weakness that started 04/28/18 in the morning after breakfast. Pt is currently taking care of her 48 y/o son who has had fevers and flu like symptoms. Pt not complaining of any pain.

## 2018-04-29 NOTE — ED Notes (Signed)
Pt ambulated to bathroom with light assist from her husband. Pt looks and states she feels a lot better.

## 2018-04-29 NOTE — ED Provider Notes (Signed)
Emergency Department Provider Note   I have reviewed the triage vital signs and the nursing notes.   HISTORY  Chief Complaint generalized weakness   HPI Alexis Bell is a 48 y.o. female who presents the emergency part today secondary to generalized weakness.  Patient states that she has felt queasy all day with nausea and upset stomach with decreased p.o. intake after went to bed early yesterday for the same reason.  No fevers.  She woke up at 2:00 and had a cookie went back to bed she woke up couple hours later and went to the bathroom when she got up to up from the bathroom she felt very lightheaded when she fell and hit her head and neck.  Husband checked her blood sugar and it was normal she had trouble reaching and drinking Gatorade.  She was brought here for further evaluation. No other associated or modifying symptoms.    Past Medical History:  Diagnosis Date  . Breast calcifications 11/2011   left  . Dental crown present   . Headache(784.0)    pre-menstrual    There are no active problems to display for this patient.   Past Surgical History:  Procedure Laterality Date  . BREAST BIOPSY  12/12/2011   Procedure: BREAST BIOPSY WITH NEEDLE LOCALIZATION;  Surgeon: Haywood Lasso, MD;  Location: [place];  Service: General;  Laterality: Left;  Wire localization excision Left breast calcifications  . WISDOM TOOTH EXTRACTION        Allergies Patient has no known allergies.  Family History  Problem Relation Age of Onset  . Cancer Maternal Grandmother        breast    Social History Social History   Tobacco Use  . Smoking status: Never Smoker  . Smokeless tobacco: Never Used  Substance Use Topics  . Alcohol use: No  . Drug use: No    Review of Systems  All other systems negative except as documented in the HPI. All pertinent positives and negatives as reviewed in the HPI. ____________________________________________   PHYSICAL  EXAM:  VITAL SIGNS: ED Triage Vitals  Enc Vitals Group     BP 04/29/18 0325 (!) 97/43     Pulse Rate 04/29/18 0325 63     Resp 04/29/18 0325 19     Temp 04/29/18 0325 98 F (36.7 C)     Temp Source 04/29/18 0325 Oral     SpO2 04/29/18 0325 100 %    Constitutional: Alert and oriented. Sleepy appearing but in no acute distress. Eyes: Conjunctivae are normal. PERRL. EOMI. Head: Atraumatic. Nose: No congestion/rhinnorhea. Mouth/Throat: Mucous membranes are moist.  Oropharynx non-erythematous. Neck: No stridor.  No meningeal signs.   Cardiovascular: Normal rate, regular rhythm. Good peripheral circulation. Grossly normal heart sounds.  Soft BP's. Respiratory: Normal respiratory effort.  No retractions. Lungs CTAB. Gastrointestinal: Soft and nontender. No distention.  Musculoskeletal: No lower extremity tenderness nor edema. No gross deformities of extremities. Neurologic:  Normal speech and language. No gross focal neurologic deficits are appreciated.  Skin:  Skin is warm, dry and intact. No rash noted.   ____________________________________________   LABS (all labs ordered are listed, but only abnormal results are displayed)  Labs Reviewed  BASIC METABOLIC PANEL - Abnormal; Notable for the following components:      Result Value   Glucose, Bld 116 (*)    Calcium 8.0 (*)    All other components within normal limits  CBG MONITORING, ED - Abnormal; Notable for  the following components:   Glucose-Capillary 103 (*)    All other components within normal limits  CBC  URINALYSIS, ROUTINE W REFLEX MICROSCOPIC  LACTIC ACID, PLASMA  TSH  INFLUENZA PANEL BY PCR (TYPE A & B)  CBG MONITORING, ED  I-STAT BETA HCG BLOOD, ED ([facility], [facility], [facility] ONLY)   ____________________________________________  EKG   EKG Interpretation  Date/Time:  Sunday April 29 2018 03:26:51 EDT Ventricular Rate:  67 PR Interval:    QRS Duration: 90 QT Interval:  447 QTC Calculation: 472 R Axis:   71 Text  Interpretation:  Sinus rhythm No old tracing to compare Confirmed by Merrily Pew [phone]) on 04/29/2018 3:46:46 AM Also confirmed by Merrily Pew [phone]), editor Philomena Doheny [phone])  on 04/29/2018 7:21:32 AM       ____________________________________________  RADIOLOGY  No results found.  ____________________________________________   PROCEDURES  Procedure(s) performed:   Procedures   ____________________________________________   INITIAL IMPRESSION / ASSESSMENT AND PLAN / ED COURSE  No obvious causes for symptoms but sounds like clinical dehydration. Will eval/workup for same. No fever to suggest SBI. No ttp to suggest fracture or serious head trauma.   No obvious causes for her soft BP, significantly improved here and symptoms as well. Tolerating PO. Ambulating without difficulty.    Pertinent labs & imaging results that were available during my care of the patient were reviewed by me and considered in my medical decision making (see chart for details).  ____________________________________________  FINAL CLINICAL IMPRESSION(S) / ED DIAGNOSES  Final diagnoses:  Weakness     MEDICATIONS GIVEN DURING THIS VISIT:  Medications  lactated ringers bolus 1,000 mL (0 mLs Intravenous Stopped 04/29/18 0631)  ondansetron (ZOFRAN) injection 4 mg (4 mg Intravenous Given 04/29/18 0505)     NEW OUTPATIENT MEDICATIONS STARTED DURING THIS VISIT:  Discharge Medication List as of 04/29/2018  7:56 AM    START taking these medications   Details  ondansetron (ZOFRAN) 4 MG tablet Take 1 tablet (4 mg total) by mouth every 8 (eight) hours as needed for nausea or vomiting., Starting Sun 04/29/2018, Print        Note:  This note was prepared with assistance of Dragon voice recognition software. Occasional wrong-word or sound-a-like substitutions may have occurred due to the inherent limitations of voice recognition software.   Demarea Lorey, Corene Cornea, MD 04/29/18 [phone]

## 2019-09-15 IMAGING — MR MR LUMBAR SPINE W/O CM
4 of 5 series · 26 of 48 positions shown · non-contrast
Comparison: 02/10/2016

CLINICAL DATA: Left lumbar radiculopathy

EXAM:
MRI LUMBAR SPINE WITHOUT CONTRAST
TECHNIQUE: Multiplanar, multisequence MR imaging of the lumbar spine was
performed. No intravenous contrast was administered.

[Series 4: T1 · sagittal · 4.0mm · 0.55mm/px · 5 of 13 slices shown (1 of 2)]
[im 1/13]
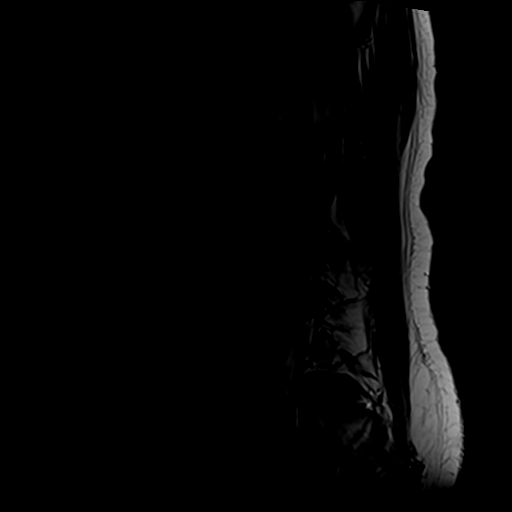
[im 4/13]
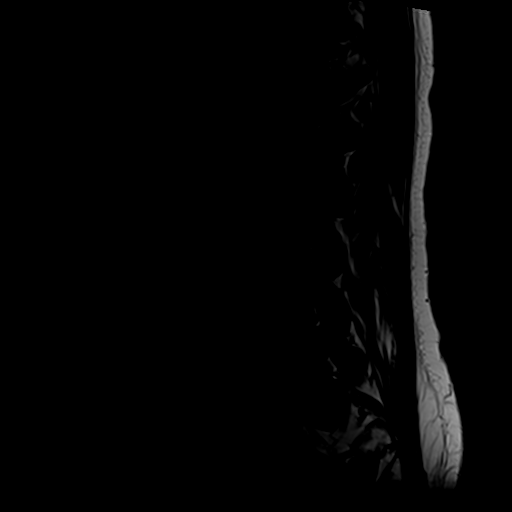
[im 7/13]
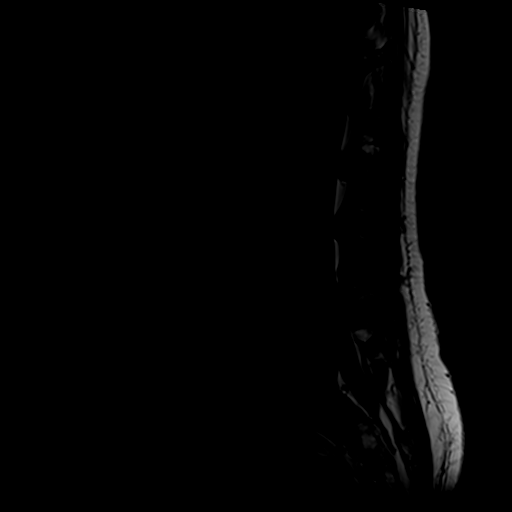
[im 10/13]
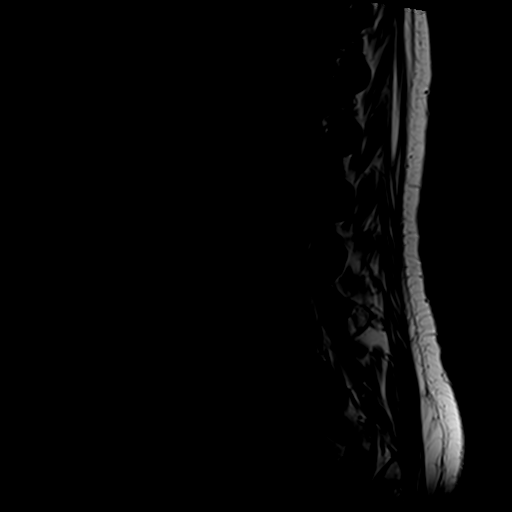
[im 13/13]
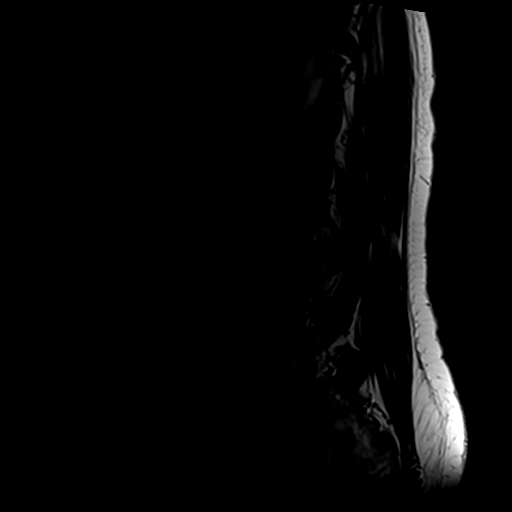

[Series 5: T2 · sagittal · 4.0mm · 0.55mm/px · 5 of 13 slices shown (1 of 2)]
[im 1/13]
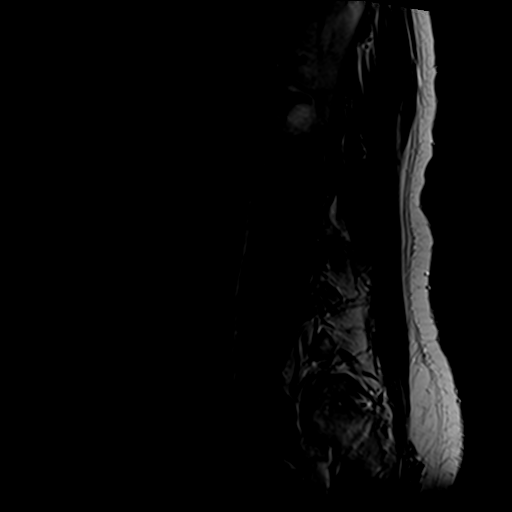
[im 4/13]
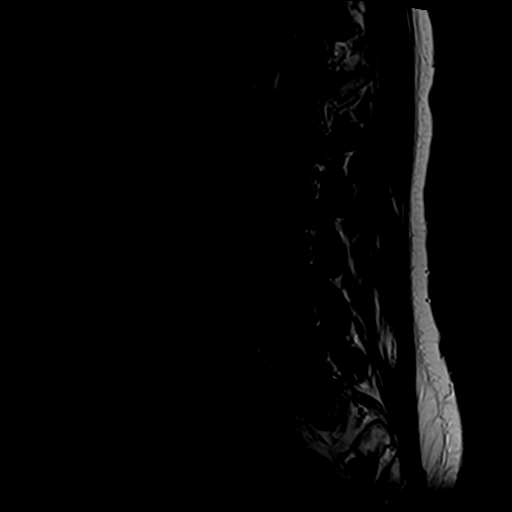
[im 7/13]
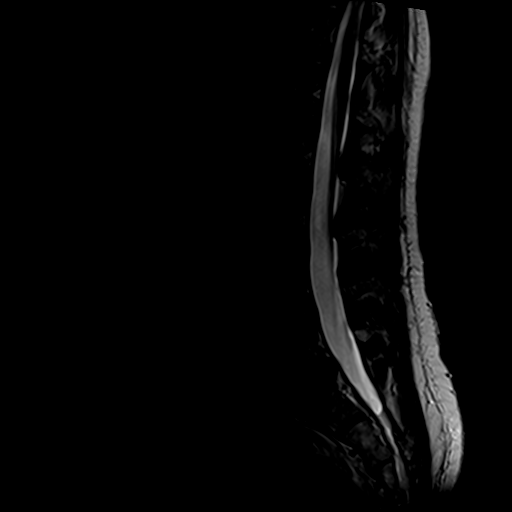
[im 10/13]
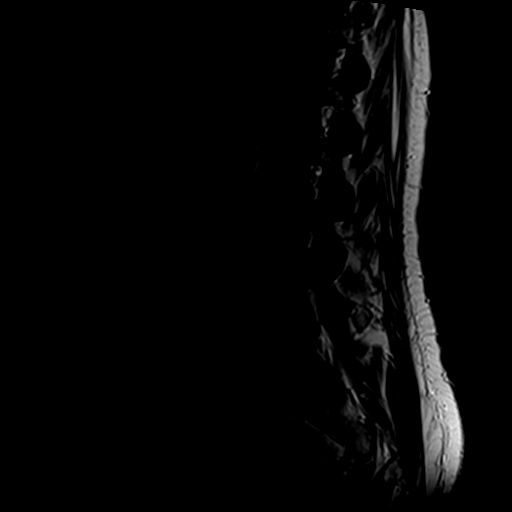
[im 13/13]
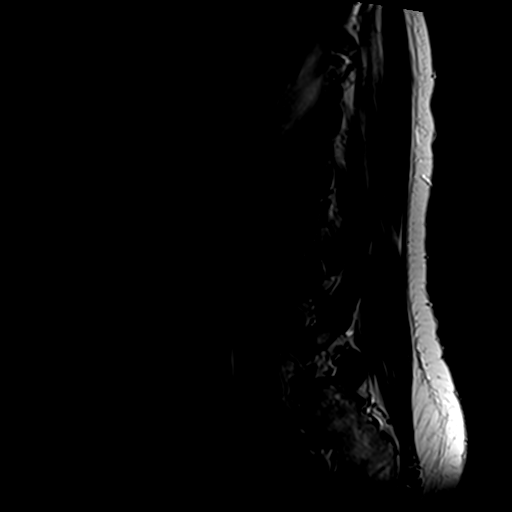

[Series 8: T2 · axial · 4.0mm · 0.70mm/px · z∈[-142,+77]mm · 10 of 40 slices shown (2 of 2)]
[im 3/40]
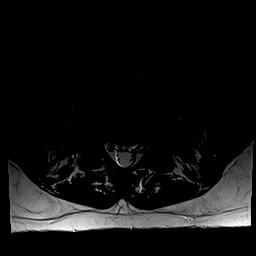
[im 6/40]
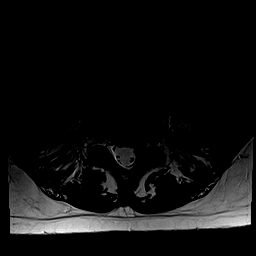
[im 8/40]
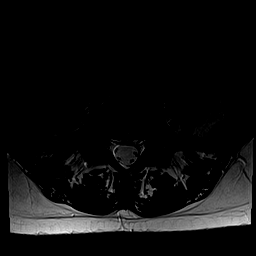
[im 14/40]
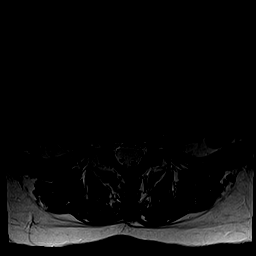
[im 19/40]
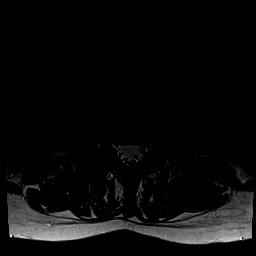
[im 21/40]
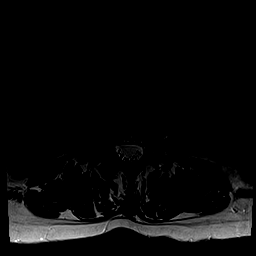
[im 24/40]
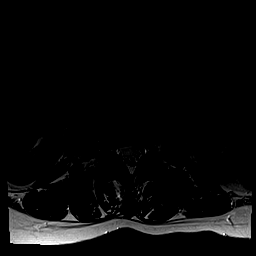
[im 29/40]
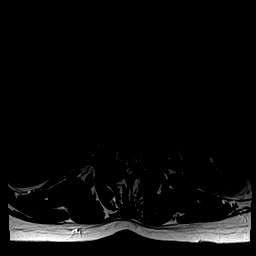
[im 34/40]
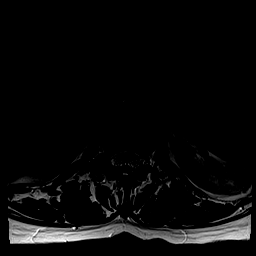
[im 40/40]
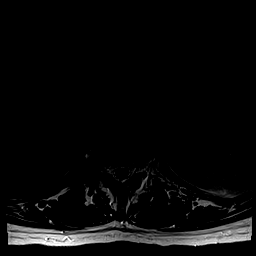

[Series 11: T1 · axial · 4.0mm · 0.35mm/px · z∈[-142,+45]mm · 6 of 40 slices shown (2 of 2)]
[im 3/40]
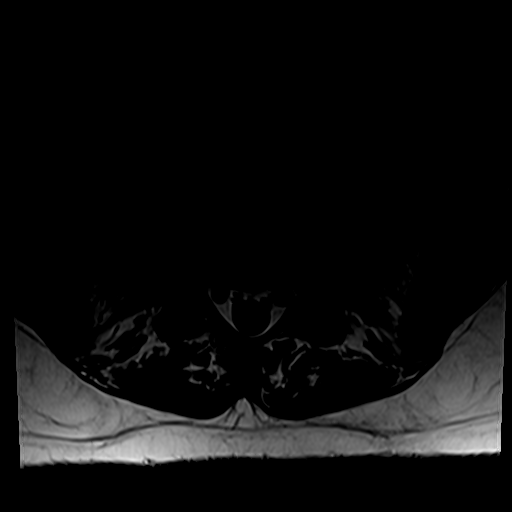
[im 6/40]
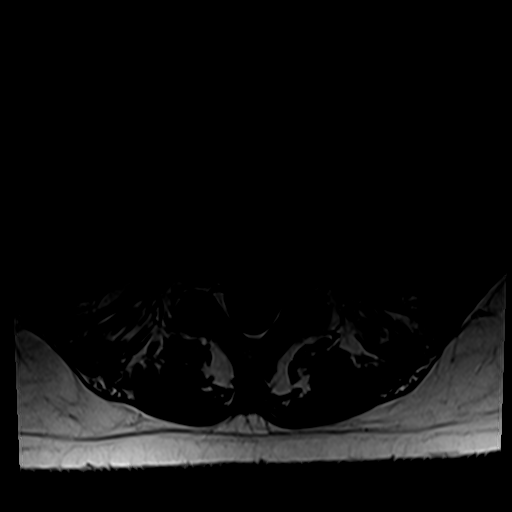
[im 8/40]
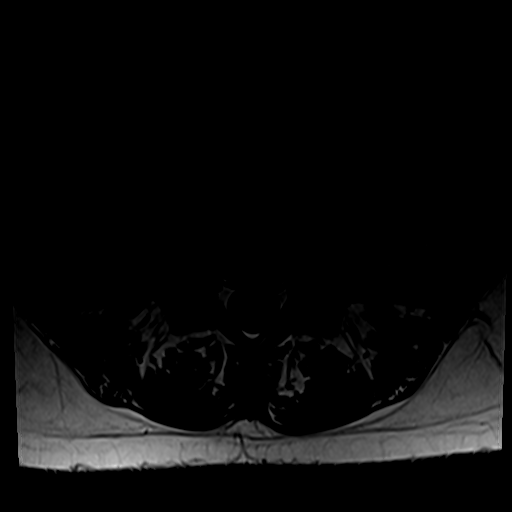
[im 14/40]
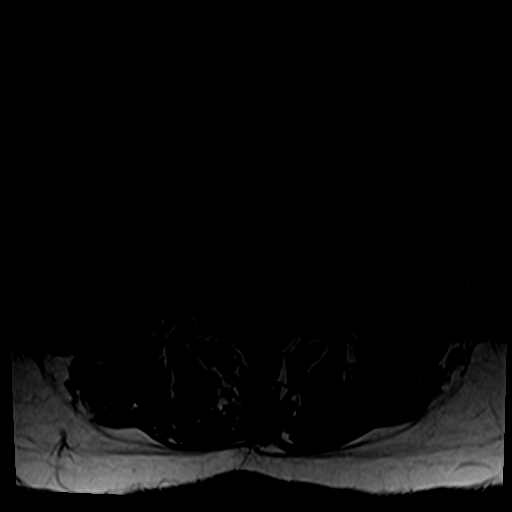
[im 21/40]
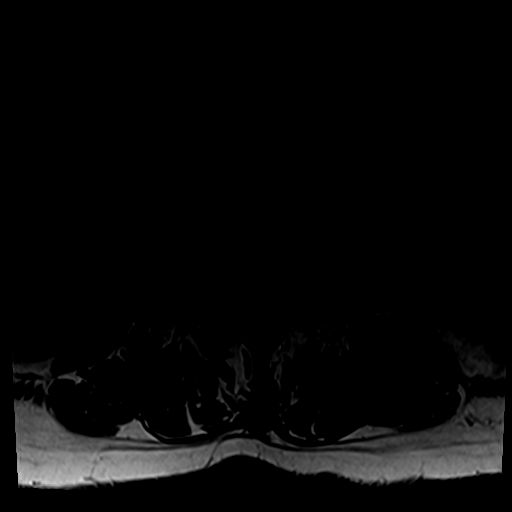
[im 34/40]
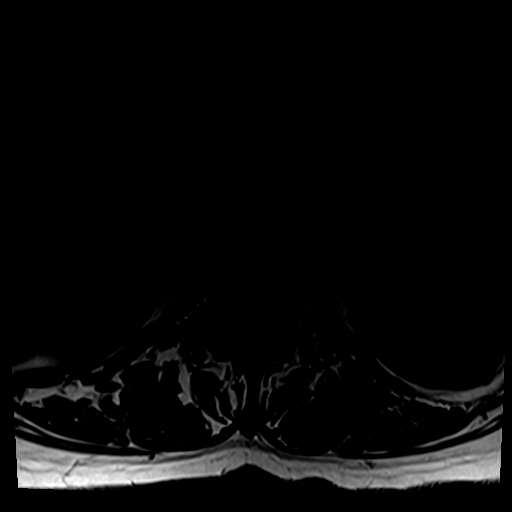

[26 of 48 positions shown; findings below may reference images not displayed]

FINDINGS: Segmentation:  Standard lumbar numbering

Alignment:  Normal

Vertebrae:  No fracture, evidence of discitis, or bone lesion.

Conus medullaris and cauda equina: Conus extends to the L1-2 level.
Conus and cauda equina appear normal.

Paraspinal and other soft tissues: Negative

Disc levels:

L5-S1 disc narrowing and desiccation with a left paracentral
extrusion impinging on the S1 nerve root
IMPRESSION: Focal L5-S1 disc degeneration with left paracentral extrusion
impinging on the left S1 nerve root.

## 2021-03-08 ENCOUNTER — Telehealth: Payer: Self-pay | Admitting: *Deleted

## 2021-03-08 NOTE — Telephone Encounter (Signed)
Patient will possibly be new to our practice, wanting to know if we do a swift microwave treatment for warts.

## 2021-03-09 NOTE — Telephone Encounter (Signed)
Notified the patient and she said that this treatment is new on the market and she has been doing a lot of reading on it.she was interested if our office offered it.

## 2021-11-17 ENCOUNTER — Other Ambulatory Visit: Payer: Self-pay | Admitting: Emergency Medicine

## 2021-11-17 DIAGNOSIS — N939 Abnormal uterine and vaginal bleeding, unspecified: Secondary | ICD-10-CM

## 2021-11-23 ENCOUNTER — Other Ambulatory Visit: Payer: BC Managed Care – PPO

## 2022-10-31 ENCOUNTER — Other Ambulatory Visit: Payer: Self-pay | Admitting: Internal Medicine

## 2022-10-31 DIAGNOSIS — E785 Hyperlipidemia, unspecified: Secondary | ICD-10-CM

## 2022-11-02 ENCOUNTER — Other Ambulatory Visit: Payer: Self-pay

## 2022-11-15 ENCOUNTER — Ambulatory Visit
Admission: RE | Admit: 2022-11-15 | Discharge: 2022-11-15 | Disposition: A | Discharge status: Home / Self Care | Payer: No Typology Code available for payment source | Source: Ambulatory Visit | Attending: Internal Medicine | Admitting: Internal Medicine

## 2022-11-15 DIAGNOSIS — E785 Hyperlipidemia, unspecified: Secondary | ICD-10-CM
